# Patient Record
Sex: Male | Born: 2000 | Race: Black or African American | Hispanic: No | Marital: Single | Smoking: Never smoker
Health system: Southern US, Community
[De-identification: ages and names within clinical notes are randomized; demographics above are authoritative.]

## PROBLEM LIST (undated history)

## (undated) HISTORY — PX: OTHER SURGICAL HISTORY: SHX169

---
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
## 2021-01-11 ENCOUNTER — Ambulatory Visit
Admission: RE | Admit: 2021-01-11 | Discharge: 2021-01-11 | Disposition: A | Discharge status: Home / Self Care | Payer: 59 | Source: Ambulatory Visit | Attending: Orthopedic Surgery | Admitting: Orthopedic Surgery

## 2021-01-11 DIAGNOSIS — S62002G Unspecified fracture of navicular [scaphoid] bone of left wrist, subsequent encounter for fracture with delayed healing: Secondary | ICD-10-CM

## 2021-05-23 ENCOUNTER — Emergency Department (HOSPITAL_BASED_OUTPATIENT_CLINIC_OR_DEPARTMENT_OTHER): Payer: 59

## 2021-05-23 ENCOUNTER — Emergency Department (HOSPITAL_BASED_OUTPATIENT_CLINIC_OR_DEPARTMENT_OTHER)
Admission: EM | Admit: 2021-05-23 | Discharge: 2021-05-23 | Disposition: A | Discharge status: Home / Self Care | Payer: 59 | Attending: Emergency Medicine | Admitting: Emergency Medicine

## 2021-05-23 ENCOUNTER — Encounter (HOSPITAL_BASED_OUTPATIENT_CLINIC_OR_DEPARTMENT_OTHER): Payer: Self-pay | Admitting: Emergency Medicine

## 2021-05-23 ENCOUNTER — Other Ambulatory Visit: Payer: Self-pay

## 2021-05-23 DIAGNOSIS — Y92481 Parking lot as the place of occurrence of the external cause: Secondary | ICD-10-CM | POA: Insufficient documentation

## 2021-05-23 DIAGNOSIS — S0033XA Contusion of nose, initial encounter: Secondary | ICD-10-CM | POA: Diagnosis not present

## 2021-05-23 DIAGNOSIS — R519 Headache, unspecified: Secondary | ICD-10-CM | POA: Diagnosis not present

## 2021-05-23 DIAGNOSIS — S0992XA Unspecified injury of nose, initial encounter: Secondary | ICD-10-CM | POA: Diagnosis present

## 2021-05-23 MED ORDER — CYCLOBENZAPRINE HCL 10 MG PO TABS
10.0000 mg | ORAL_TABLET | Freq: Once | ORAL | Status: AC
  Administered 2021-05-23: 10 mg via ORAL
  Filled 2021-05-23: qty 1

## 2021-05-23 MED ORDER — CYCLOBENZAPRINE HCL 10 MG PO TABS: 10.0000 mg | ORAL_TABLET | Freq: Three times a day (TID) | ORAL | 0 refills | Status: DC | PRN

## 2021-05-23 MED ORDER — NAPROXEN 250 MG PO TABS
500.0000 mg | ORAL_TABLET | Freq: Once | ORAL | Status: AC
  Administered 2021-05-23: 500 mg via ORAL
  Filled 2021-05-23: qty 2

## 2021-05-23 MED ORDER — NAPROXEN 375 MG PO TABS: ORAL_TABLET | ORAL | 0 refills | Status: DC
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
## 2021-05-23 NOTE — ED Notes (Signed)
Patient transported to X-ray 
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
## 2023-06-09 ENCOUNTER — Other Ambulatory Visit: Payer: Self-pay

## 2023-06-09 ENCOUNTER — Emergency Department
Admission: EM | Admit: 2023-06-09 | Discharge: 2023-06-09 | Disposition: A | Discharge status: Home / Self Care | Payer: 59 | Attending: Emergency Medicine | Admitting: Emergency Medicine

## 2023-06-09 ENCOUNTER — Encounter: Payer: Self-pay | Admitting: Emergency Medicine

## 2023-06-09 DIAGNOSIS — K120 Recurrent oral aphthae: Secondary | ICD-10-CM | POA: Insufficient documentation

## 2023-06-09 MED ORDER — LIDOCAINE VISCOUS HCL 2 % MT SOLN
15.0000 mL | Freq: Once | OROMUCOSAL | Status: AC
  Administered 2023-06-09: 15 mL via OROMUCOSAL
  Filled 2023-06-09: qty 15

## 2023-06-09 MED ORDER — NYSTATIN 100000 UNIT/ML MT SUSP: 5.0000 mL | Freq: Four times a day (QID) | OROMUCOSAL | 0 refills | Status: DC | PRN

## 2023-06-09 NOTE — Discharge Instructions (Addendum)
Use the Magic mouthwash as directed.  Avoid salty or hot foods at this time.  Rinse your mouth with warm salty water or hydrogen peroxide half-strength.  Follow-up with the local health department or primary dental provider.

## 2023-06-09 NOTE — ED Notes (Signed)
See triage note. Pt had mouth lesions that appeared approximately 3-4 days ago. Patient noticed it after he ate chips but does not believe that was the cause of the lesions. Denies any pain unless talking,eating or drinking. Denies any other issues at this time.

## 2023-06-09 NOTE — ED Triage Notes (Signed)
Pt presents ambulatory to triage via POV with complaints of oral ulcer to the L cheek x 3 days. No meds taken PTA. Mild erythema to his cheek. Denies pain unless consuming food or beverage. A&Ox4 at this time. Denies CP or SOB.

## 2023-06-09 NOTE — ED Provider Notes (Signed)
[facility] Emergency Department Provider Note     Event Date/Time   First MD Initiated Contact with Patient 06/09/23 2131     (approximate)   History   Mouth Lesions   HPI  Jordan Vincent is a 22 y.o. male with no significant medical history, presents himself to the ED for evaluation of multiple ulcers to his mouth, cheek, and tongue.  Patient reports symptoms for the last 2 to 3 days.  He states no significant benefit with salt water gargles, hydroperoxide gargles, or other measures.  He denies any fevers, chills, or sweats.  No difficulty breathing, swallowing, or controlling oral secretions.   Physical Exam   Triage Vital Signs: ED Triage Vitals  Enc Vitals Group     BP 06/09/23 2047 111/70     Pulse Rate 06/09/23 2047 65     Resp 06/09/23 2047 16     Temp 06/09/23 2047 98.3 F (36.8 C)     Temp Source 06/09/23 2047 Oral     SpO2 06/09/23 2047 100 %     Weight 06/09/23 2046 168 lb (76.2 kg)     Height 06/09/23 2046 5\' 11"  (1.803 m)     Head Circumference --      Peak Flow --      Pain Score 06/09/23 2046 0     Pain Loc --      Pain Edu? --      Excl. in GC? --     Most recent vital signs: Vitals:   06/09/23 2047 06/09/23 2241  BP: 111/70 110/68  Pulse: 65 65  Resp: 16 16  Temp: 98.3 F (36.8 C)   SpO2: 100% 98%    General Awake, no distress. NAD HEENT NCAT. PERRL. EOMI. No rhinorrhea. Mucous membranes are moist.  Patient with several large flat whitish ulcers noted to the buccal mucosa on the left upper gum, the lateral left tongue, as well as the right lower buccal mucosa.  Uvula is midline and no tonsillar lesions are appreciated.  No palpable cervical lymphadenopathy on exam. CV:  Good peripheral perfusion.  RESP:  Normal effort.  ABD:  No distention.     ED Results / Procedures / Treatments   Labs (all labs ordered are listed, but only abnormal results are displayed) Labs Reviewed - No data to  display   EKG   RADIOLOGY   No results found.   PROCEDURES:  Critical Care performed: No  Procedures   MEDICATIONS ORDERED IN ED: Medications  lidocaine (XYLOCAINE) 2 % viscous mouth solution 15 mL (15 mLs Mouth/Throat Given 06/09/23 2238)     IMPRESSION / MDM / ASSESSMENT AND PLAN / ED COURSE  I reviewed the triage vital signs and the nursing notes.                              Differential diagnosis includes, but is not limited to, aphthous ulcers, gingival stomatitis, HSV stomatitis, xerostomia, thrush  Patient's presentation is most consistent with acute, uncomplicated illness.  Patient's diagnosis is consistent with oral aphthae. Patient will be discharged home with prescriptions for Magic mouthwash. Patient is to follow up with his dental provider as needed or otherwise directed. Patient is given ED precautions to return to the ED for any worsening or new symptoms.   FINAL CLINICAL IMPRESSION(S) / ED DIAGNOSES   Final diagnoses:  Aphthae, oral     Rx / DC Orders  ED Discharge Orders          Ordered    magic mouthwash (nystatin, lidocaine, diphenhydrAMINE, alum & mag hydroxide) suspension  4 times daily PRN        06/09/23 2229             Note:  This document was prepared using Dragon voice recognition software and may include unintentional dictation errors.    Lissa Hoard, PA-C 06/10/23 2308    Georga Hacking, MD 06/14/23 [phone]

## 2024-12-02 ENCOUNTER — Other Ambulatory Visit: Payer: Self-pay

## 2024-12-02 ENCOUNTER — Encounter (HOSPITAL_BASED_OUTPATIENT_CLINIC_OR_DEPARTMENT_OTHER): Payer: Self-pay

## 2024-12-02 ENCOUNTER — Emergency Department (HOSPITAL_BASED_OUTPATIENT_CLINIC_OR_DEPARTMENT_OTHER)
Admission: EM | Admit: 2024-12-02 | Discharge: 2024-12-02 | Disposition: A | Discharge status: Home / Self Care | Attending: Emergency Medicine | Admitting: Emergency Medicine

## 2024-12-02 DIAGNOSIS — R21 Rash and other nonspecific skin eruption: Secondary | ICD-10-CM | POA: Insufficient documentation

## 2024-12-02 DIAGNOSIS — K121 Other forms of stomatitis: Secondary | ICD-10-CM | POA: Insufficient documentation

## 2024-12-02 DIAGNOSIS — K1379 Other lesions of oral mucosa: Secondary | ICD-10-CM | POA: Diagnosis present

## 2024-12-02 LAB — COMPREHENSIVE METABOLIC PANEL WITH GFR
ALT: 10 U/L (ref 0–44)
AST: 24 U/L (ref 15–41)
Albumin: 5.2 g/dL — ABNORMAL HIGH (ref 3.5–5.0)
Alkaline Phosphatase: 77 U/L (ref 38–126)
Anion gap: 13 (ref 5–15)
BUN: 19 mg/dL (ref 6–20)
CO2: 23 mmol/L (ref 22–32)
Calcium: 9.8 mg/dL (ref 8.9–10.3)
Chloride: 99 mmol/L (ref 98–111)
Creatinine, Ser: 1.19 mg/dL (ref 0.61–1.24)
GFR, Estimated: >60 mL/min (ref >60)
Glucose, Bld: 87 mg/dL (ref 70–99)
Potassium: 4.5 mmol/L (ref 3.5–5.1)
Sodium: 135 mmol/L (ref 135–145)
Total Bilirubin: 0.6 mg/dL (ref 0.0–1.2)
Total Protein: 8.5 g/dL — ABNORMAL HIGH (ref 6.5–8.1)

## 2024-12-02 LAB — CBC WITH DIFFERENTIAL/PLATELET
Abs Immature Granulocytes: 0 K/uL (ref 0.00–0.07)
Basophils Absolute: 0 K/uL (ref 0.0–0.1)
Basophils Relative: 0 %
Eosinophils Absolute: 0 K/uL (ref 0.0–0.5)
Eosinophils Relative: 0 %
HCT: 43.8 % (ref 39.0–52.0)
Hemoglobin: 15.4 g/dL (ref 13.0–17.0)
Immature Granulocytes: 0 %
Lymphocytes Relative: 37 %
Lymphs Abs: 1.5 K/uL (ref 0.7–4.0)
MCH: 29.5 pg (ref 26.0–34.0)
MCHC: 35.2 g/dL (ref 30.0–36.0)
MCV: 83.9 fL (ref 80.0–100.0)
Monocytes Absolute: 0.3 K/uL (ref 0.1–1.0)
Monocytes Relative: 7 %
Neutro Abs: 2.2 K/uL (ref 1.7–7.7)
Neutrophils Relative %: 56 %
Platelets: 179 K/uL (ref 150–400)
RBC: 5.22 MIL/uL (ref 4.22–5.81)
RDW: 12.7 % (ref 11.5–15.5)
WBC: 4 K/uL (ref 4.0–10.5)
nRBC: 0 % (ref 0.0–0.2)

## 2024-12-02 LAB — HIV ANTIBODY (ROUTINE TESTING W REFLEX): HIV Screen 4th Generation wRfx: NONREACTIVE

## 2024-12-02 MED ORDER — LIDOCAINE VISCOUS HCL 2 % MT SOLN
15.0000 mL | Freq: Once | OROMUCOSAL | Status: AC
  Administered 2024-12-02: 15 mL via OROMUCOSAL
  Filled 2024-12-02: qty 15

## 2024-12-02 MED ORDER — LIDOCAINE VISCOUS HCL 2 % MT SOLN: 5.0000 mL | Freq: Four times a day (QID) | OROMUCOSAL | 0 refills | Status: AC | PRN

## 2024-12-02 NOTE — ED Provider Notes (Signed)
 [place] [facility] Provider Note   CSN: [id number] Arrival date & time: 12/02/24  1419     Patient presents with: Mouth Lesions   Jordan Vincent is a 23 y.o. male.   Patient presents to the emergency department for evaluation of systemic rash and mucositis.  Patient had mouth ulcers develop, treated with Magic mouthwash last year, resolved.  Patient reports that symptoms started about 5 days ago.  Patient has had a rash on his skin including his hands and feet.  He is developed significant sores in the mouth making it difficult to open his mouth and drink.  He has been taking over-the-counter medications.  Family member gave him a dose of erythromycin at home, but after the symptoms started.  Patient reports subjective fever and chills at home.  No vomiting or diarrhea.  No history of autoimmune disease.  Denies any antibiotics or new medications preceding current symptoms.  Denies drug use.  He has been sexually active in the past, no recent testing for STIs.  No tick bites recently, however he did buy a secondhand couch off of [employer].  No dysuria.        Prior to Admission medications  Medication Sig Start Date End Date Taking? Authorizing Provider  magic mouthwash (nystatin , lidocaine , diphenhydrAMINE, alum & mag hydroxide) suspension Swish and spit 5 mLs 4 (four) times daily as needed for mouth pain. 06/09/23   Menshew, Candida LULLA Kings, PA-C    Allergies: Patient has no known allergies.    Review of Systems  Updated Vital Signs BP 131/79   Pulse 80   Temp 99.1 F (37.3 C) (Tympanic)   Resp 18   Ht 5' 11 (1.803 m)   Wt 72.6 kg   SpO2 100%   BMI 22.32 kg/m   Physical Exam Vitals and nursing note reviewed.  Constitutional:      General: He is not in acute distress.    Appearance: He is well-developed.  HENT:     Head: Normocephalic and atraumatic.     Right Ear: External ear normal.     Left Ear: External ear normal.      Mouth/Throat:     Comments: Sores of the buccal mucosa, severe mucositis. Eyes:     General:        Right eye: No discharge.        Left eye: No discharge.     Conjunctiva/sclera: Conjunctivae normal.  Cardiovascular:     Rate and Rhythm: Normal rate and regular rhythm.     Heart sounds: Normal heart sounds.  Pulmonary:     Effort: Pulmonary effort is normal.     Breath sounds: Normal breath sounds.  Abdominal:     Palpations: Abdomen is soft.     Tenderness: There is no abdominal tenderness.  Musculoskeletal:     Cervical back: Normal range of motion and neck supple.  Skin:    General: Skin is warm and dry.     Comments: Patient with macules noted on the skin, circular, scattered over the chest and back, also macules noted on the palms.  Neurological:     Mental Status: He is alert.        (all labs ordered are listed, but only abnormal results are displayed) Labs Reviewed  COMPREHENSIVE METABOLIC PANEL WITH GFR - Abnormal; Notable for the following components:      Result Value   Total Protein 8.5 (*)    Albumin 5.2 (*)  All other components within normal limits  CBC WITH DIFFERENTIAL/PLATELET  SYPHILIS: RPR W/REFLEX TO RPR TITER AND TREPONEMAL ANTIBODIES, TRADITIONAL SCREENING AND DIAGNOSIS ALGORITHM  HIV ANTIBODY (ROUTINE TESTING W REFLEX)    EKG: None  Radiology: No results found.   Procedures   Medications Ordered in the ED  lidocaine  (XYLOCAINE ) 2 % viscous mouth solution 15 mL (has no administration in time range)  lidocaine  (XYLOCAINE ) 2 % viscous mouth solution 15 mL (15 mLs Mouth/Throat Given 12/02/24 1540)   ED Course  Patient seen and examined. History obtained directly from patient.   Labs/EKG: Ordered CBC and CMP.  Will also send RPR and HIV.  Imaging: None ordered  Medications/Fluids: Ordered: Viscous lidocaine   Most recent vital signs reviewed and are as follows: BP 131/79   Pulse 80   Temp 99.1 F (37.3 C) (Tympanic)   Resp  18   Ht 5' 11 (1.803 m)   Wt 72.6 kg   SpO2 100%   BMI 22.32 kg/m   Initial impression: Patient with systemic rash, involving palms and soles, also severe mucositis.  His overall picture would be most consistent with coxsackievirus.  However given involvement of the palms and soles, do not want to overlook syphilis.  Other autoimmune processes could be possible, especially with previous episode of oral ulcers over a year ago, however this is not something that we can work up in the emergency department.  Patient looks well, nontoxic.  This does not appear to be Stevens-Johnson syndrome or toxic epidermal necrolysis.  5:09 PM Reassessment performed. Patient appears stable, comfortable.  Lidocaine  helped earlier.  Additional dose prior to discharge.  Labs personally reviewed and interpreted including: CBC unremarkable; CMP unremarkable.  HIV and RPR pending.  Reviewed pertinent lab work and imaging with patient at bedside. Questions answered.  Encouraged rest, focus on hydration over the next several days.  Work note given.  Most current vital signs reviewed and are as follows: BP 128/74 (BP Location: Right Arm)   Pulse 84   Temp 99.1 F (37.3 C) (Oral)   Resp 20   Ht 5' 11 (1.803 m)   Wt 72.6 kg   SpO2 100%   BMI 22.32 kg/m   Plan: Discharge to home.   Prescriptions written for: Magic mouthwash, discussed use of over-the-counter medications for pain as well.  Other home care instructions discussed: Maintain good hydration  ED return instructions discussed: Uncontrolled pain, unable to hydrate, new or worsening symptoms.  Follow-up instructions discussed: Patient encouraged to follow-up with their PCP in 7 days.                                   Medical Decision Making Amount and/or Complexity of Data Reviewed Labs: ordered.  Risk Prescription drug management.   Patient with significant stomatitis, also full body rash involving the palms and soles.  Clinically the  patient appears well and nontoxic.  Reasonably hydrated at this time.  Improved with lidocaine .  This is most likely a viral etiology such as coxsackie.  However, cannot entirely rule out syphilis or other autoimmune cause.  Patient did have stomatitis about a year and a half ago however has not had frequent recurrences.  Basic labs checked here, normal blood cell counts, liver and kidney function.  Will treat supportively at this time.  Encouraged PCP follow-up if not improved or recurrent.  The patient's vital signs, pertinent lab work and imaging were  reviewed and interpreted as discussed in the ED course. Hospitalization was considered for further testing, treatments, or serial exams/observation. However as patient is well-appearing, has a stable exam, and reassuring studies today, I do not feel that they warrant admission at this time. This plan was discussed with the patient who verbalizes agreement and comfort with this plan and seems reliable and able to return to the Emergency Department with worsening or changing symptoms.       Final diagnoses:  Rash and other nonspecific skin eruption  Stomatitis    ED Discharge Orders          Ordered    magic mouthwash (nystatin , lidocaine , diphenhydrAMINE, alum & mag hydroxide) suspension  4 times daily PRN        12/02/24 1708               Desiderio Chew, PA-C 12/02/24 1719    Long, Day Greb G, MD 12/02/24 [phone]

## 2024-12-02 NOTE — ED Notes (Signed)
 Patient transferred from waiting room to ED treatment room. Assuming pt care at this time.

## 2024-12-02 NOTE — Discharge Instructions (Addendum)
 Please read and follow all provided instructions.  Your diagnoses today include:  1. Rash and other nonspecific skin eruption   2. Stomatitis     Tests performed today include: Complete blood cell count: Normal white and red blood cell counts Complete metabolic panel: Normal liver and kidney function HIV and test for syphilis: You will be notified with any results that require further care Vital signs. See below for your results today.   Medications prescribed:  Viscous lidocaine /Magic mouthwash: Intraoral medication for pain  Take any prescribed medications only as directed.  Home care instructions:  Follow any educational materials contained in this packet.  Follow-up instructions: Please follow-up with your primary care provider in the next 7 days for further evaluation of your symptoms.   Return instructions:  Please return to the Emergency Department if you experience worsening symptoms.  Please return if you have any other emergent concerns.  Additional Information:  Your vital signs today were: BP 128/74 (BP Location: Right Arm)   Pulse 84   Temp 99.1 F (37.3 C) (Oral)   Resp 20   Ht 5' 11 (1.803 m)   Wt 72.6 kg   SpO2 100%   BMI 22.32 kg/m  If your blood pressure (BP) was elevated above 135/85 this visit, please have this repeated by your doctor within one month. --------------

## 2024-12-02 NOTE — ED Triage Notes (Addendum)
 Pt states that he has rash on his body and inside his mouth. States that the rash has been there since Monday. Pt states that he had a flare up of his wisdom teeth. Unsure of what the rash came from. Pt states that it hurts to open his mouth and to speak.

## 2024-12-03 LAB — SYPHILIS: RPR W/REFLEX TO RPR TITER AND TREPONEMAL ANTIBODIES, TRADITIONAL SCREENING AND DIAGNOSIS ALGORITHM: RPR Ser Ql: NONREACTIVE
